# Patient Record
Sex: Male | Born: 2000 | Race: Black or African American | Hispanic: No | Marital: Single | Smoking: Never smoker
Health system: Southern US, Community
[De-identification: ages and names within clinical notes are randomized; demographics above are authoritative.]

---
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
## 2021-09-11 ENCOUNTER — Other Ambulatory Visit: Payer: Self-pay

## 2021-09-11 ENCOUNTER — Ambulatory Visit: Payer: Medicaid Other | Attending: Internal Medicine

## 2021-09-11 ENCOUNTER — Other Ambulatory Visit (HOSPITAL_BASED_OUTPATIENT_CLINIC_OR_DEPARTMENT_OTHER): Payer: Self-pay

## 2021-09-11 DIAGNOSIS — Z23 Encounter for immunization: Secondary | ICD-10-CM

## 2021-09-11 NOTE — Progress Notes (Signed)
   Covid-19 Vaccination Clinic  Name:  Daniel Ellison    MRN: [id number] DOB: 2001-08-07  09/11/2021  [name] was observed post Covid-19 immunization for 15 minutes without incident. He was provided with Vaccine Information Sheet and instruction to access the V-Safe system.   [name] was instructed to call 911 with any severe reactions post vaccine: Difficulty breathing  Swelling of face and throat  A fast heartbeat  A bad rash all over body  Dizziness and weakness   Immunizations Administered     Name Date Dose VIS Date Route   PFIZER Comrnaty(Gray TOP) Covid-19 Vaccine 09/11/2021  3:28 PM 0.3 mL 08/14/2021 Intramuscular   Manufacturer: [employer], [employer]   Lot: [id number]   NDC: [phone]

## 2021-12-04 ENCOUNTER — Emergency Department (HOSPITAL_BASED_OUTPATIENT_CLINIC_OR_DEPARTMENT_OTHER)
Admission: EM | Admit: 2021-12-04 | Discharge: 2021-12-04 | Disposition: A | Discharge status: Home / Self Care | Payer: Medicaid Other | Attending: Emergency Medicine | Admitting: Emergency Medicine

## 2021-12-04 ENCOUNTER — Encounter (HOSPITAL_BASED_OUTPATIENT_CLINIC_OR_DEPARTMENT_OTHER): Payer: Self-pay | Admitting: Emergency Medicine

## 2021-12-04 ENCOUNTER — Other Ambulatory Visit: Payer: Self-pay

## 2021-12-04 ENCOUNTER — Emergency Department (HOSPITAL_BASED_OUTPATIENT_CLINIC_OR_DEPARTMENT_OTHER): Payer: Medicaid Other | Admitting: Radiology

## 2021-12-04 DIAGNOSIS — X501XXA Overexertion from prolonged static or awkward postures, initial encounter: Secondary | ICD-10-CM | POA: Diagnosis not present

## 2021-12-04 DIAGNOSIS — S93402A Sprain of unspecified ligament of left ankle, initial encounter: Secondary | ICD-10-CM | POA: Diagnosis not present

## 2021-12-04 DIAGNOSIS — Y9367 Activity, basketball: Secondary | ICD-10-CM | POA: Diagnosis not present

## 2021-12-04 DIAGNOSIS — S99912A Unspecified injury of left ankle, initial encounter: Secondary | ICD-10-CM | POA: Diagnosis present

## 2021-12-04 NOTE — Discharge Instructions (Addendum)
Your XR was negative for fractures. You likely have an ankle sprain. These can take anywhere from 1 week to 6 weeks for complete return to normal.  Please schedule an appointment with Sports Medicine if symptoms do not start improving. Recommend compression, Ice/Heat, elevation, and rest of ankle for at least one week. Please avoid strenuous physical activity until symptoms resolve.

## 2021-12-04 NOTE — ED Triage Notes (Signed)
Presents for L ankle injury that occurred Saturday while playing basketball, landed on joint while twisted. Able to bear weight, denies numbness/tingling to limb. Has tried ice and advil at home with some relief.

## 2021-12-04 NOTE — ED Provider Notes (Signed)
[facility] EMERGENCY DEPT Provider Note   CSN: [id number] Arrival date & time: 12/04/21  2043     History Chief Complaint  Patient presents with   Ankle Pain    Daniel Ellison is a 20 y.o. male. Patient presents the emergency department with a left ankle injury that occurred last Saturday.  He was playing basketball at the time where he rolled his ankle.  Swelling is present on the lateral ankle.  He has been able to bear weight on it.  He has had no numbness or tingling to the leg.  He does have limited range of motion of his ankle.  Ankle Pain     History reviewed. No pertinent past medical history.  There are no problems to display for this patient.   History reviewed. No pertinent surgical history.     Family History  Problem Relation Age of Onset   Healthy Mother    Healthy Father     Social History   Tobacco Use   Smoking status: Never   Smokeless tobacco: Never  Vaping Use   Vaping Use: Never used  Substance Use Topics   Alcohol use: Never   Drug use: Never    Home Medications Prior to Admission medications   Medication Sig Start Date End Date Taking? Authorizing Provider  cromolyn (NASALCROM) 5.2 MG/ACT nasal spray Place 1 spray into both nostrils 4 (four) times daily. 09/18/18   Georgetta Haber, NP  fluticasone (FLONASE) 50 MCG/ACT nasal spray Place 1 spray into both nostrils daily. 09/18/18   Georgetta Haber, NP    Allergies    Patient has no known allergies.  Review of Systems   Review of Systems  Musculoskeletal:  Positive for arthralgias and joint swelling.  Neurological:  Negative for weakness and numbness.  All other systems reviewed and are negative.  Physical Exam Updated Vital Signs BP (!) 146/95 (BP Location: Right Arm)    Pulse 67    Temp 98.1 F (36.7 C) (Oral)    Resp 18    Wt 66.2 kg    SpO2 100%   Physical Exam Vitals and nursing note reviewed.  Constitutional:      General: He is not in acute  distress.    Appearance: Normal appearance. He is well-developed. He is not ill-appearing, toxic-appearing or diaphoretic.  HENT:     Head: Normocephalic and atraumatic.     Nose: No nasal deformity.     Mouth/Throat:     Lips: Pink. No lesions.  Eyes:     General: Gaze aligned appropriately. No scleral icterus.       Right eye: No discharge.        Left eye: No discharge.     Conjunctiva/sclera: Conjunctivae normal.     Right eye: Right conjunctiva is not injected. No exudate or hemorrhage.    Left eye: Left conjunctiva is not injected. No exudate or hemorrhage. Pulmonary:     Effort: Pulmonary effort is normal. No respiratory distress.  Musculoskeletal:     Right ankle: Normal.     Left ankle: Swelling present. No deformity, ecchymosis or lacerations. Tenderness present over the lateral malleolus. Decreased range of motion. Normal pulse.     Comments: Patient with swelling to the lateral side of the ankle on the left foot.  He does have decreased range of motion here.  And tenderness to palpation.  Pedal pulses 2+ bilaterally.  Sensation is grossly intact.  Able to wiggle toes easily.  Skin:  General: Skin is warm and dry.  Neurological:     Mental Status: He is alert and oriented to person, place, and time.  Psychiatric:        Mood and Affect: Mood normal.        Speech: Speech normal.        Behavior: Behavior normal. Behavior is cooperative.    ED Results / Procedures / Treatments   Labs (all labs ordered are listed, but only abnormal results are displayed) Labs Reviewed - No data to display  EKG None  Radiology DG Ankle Complete Left  Result Date: 12/04/2021 CLINICAL DATA:  injury EXAM: LEFT ANKLE COMPLETE - 3+ VIEW COMPARISON:  None. FINDINGS: There is no evidence of fracture, dislocation, or joint effusion. There is no evidence of arthropathy or other focal bone abnormality. Soft tissues are unremarkable. IMPRESSION: Negative. Electronically Signed   By: Tish Frederickson M.D.   On: 12/04/2021 21:29    Procedures Procedures   Medications Ordered in ED Medications - No data to display  ED Course  I have reviewed the triage vital signs and the nursing notes.  Pertinent labs & imaging results that were available during my care of the patient were reviewed by me and considered in my medical decision making (see chart for details).    MDM Rules/Calculators/A&P                          This is a well-appearing 20 year old male who presents to the emergency department with left ankle swelling and pain after basketball injury that occurred 3 nights ago.  He has been weightbearing at home. Vital stable. X-ray obtained.  It is negative for any acute fracture.  This is likely a ankle sprain.  Supportive treatment at home recommended.  Follow-up with sports medicine doctor if symptoms do not start improving.     Final Clinical Impression(s) / ED Diagnoses Final diagnoses:  Sprain of left ankle, unspecified ligament, initial encounter    Rx / DC Orders ED Discharge Orders     None        Claudie Leach, PA-C 12/04/21 2136    Vanetta Mulders, MD 12/05/21 1725

## 2022-10-27 IMAGING — DX DG ANKLE COMPLETE 3+V*L*
1 series · 3 of 3 positions shown · non-contrast
Comparison: None.

CLINICAL DATA: injury

EXAM:
LEFT ANKLE COMPLETE - 3+ VIEW

[Series 1: ankle · 0.14mm/px · 3 of 3 slices shown]
[im 1/3]
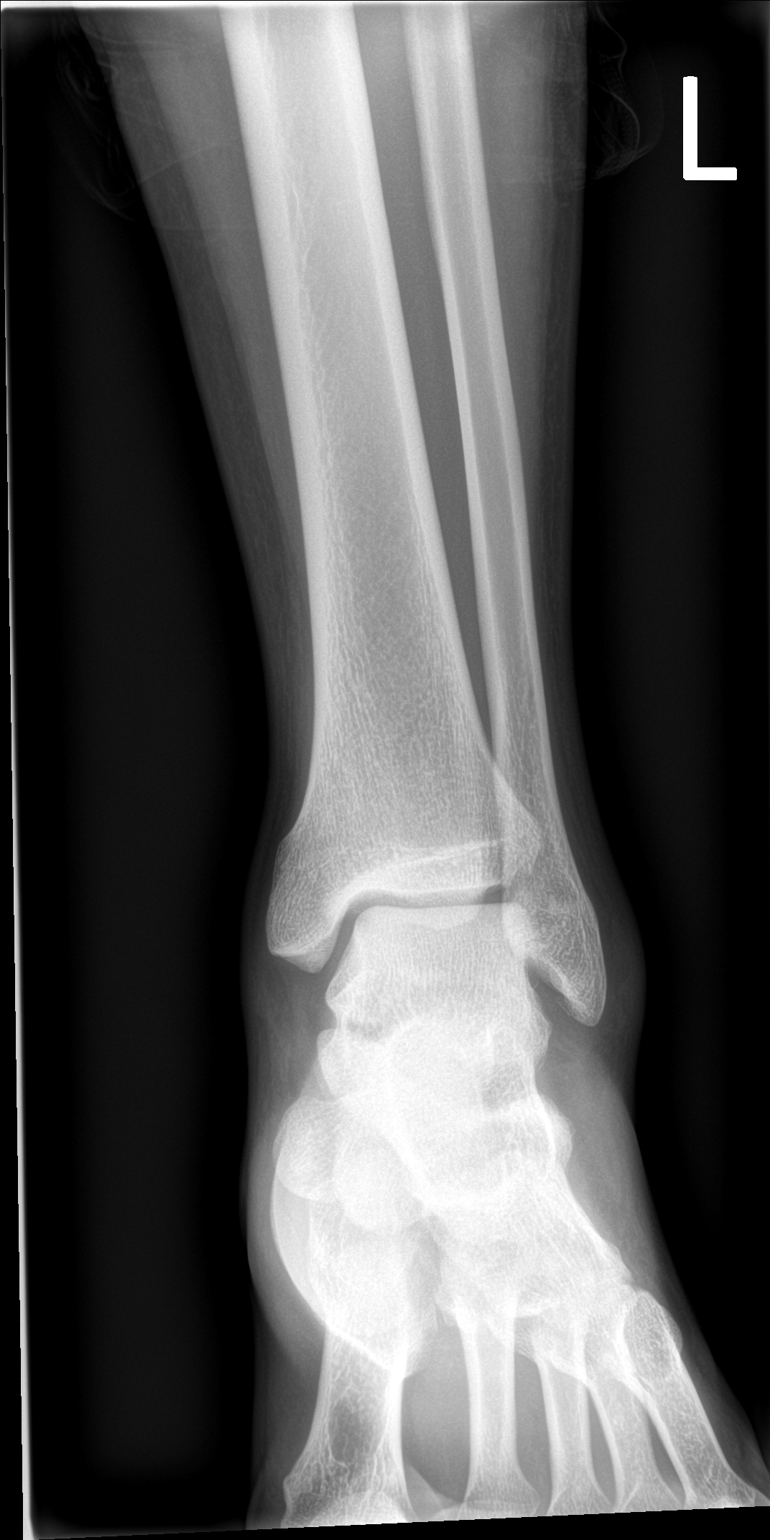
[im 2/3]
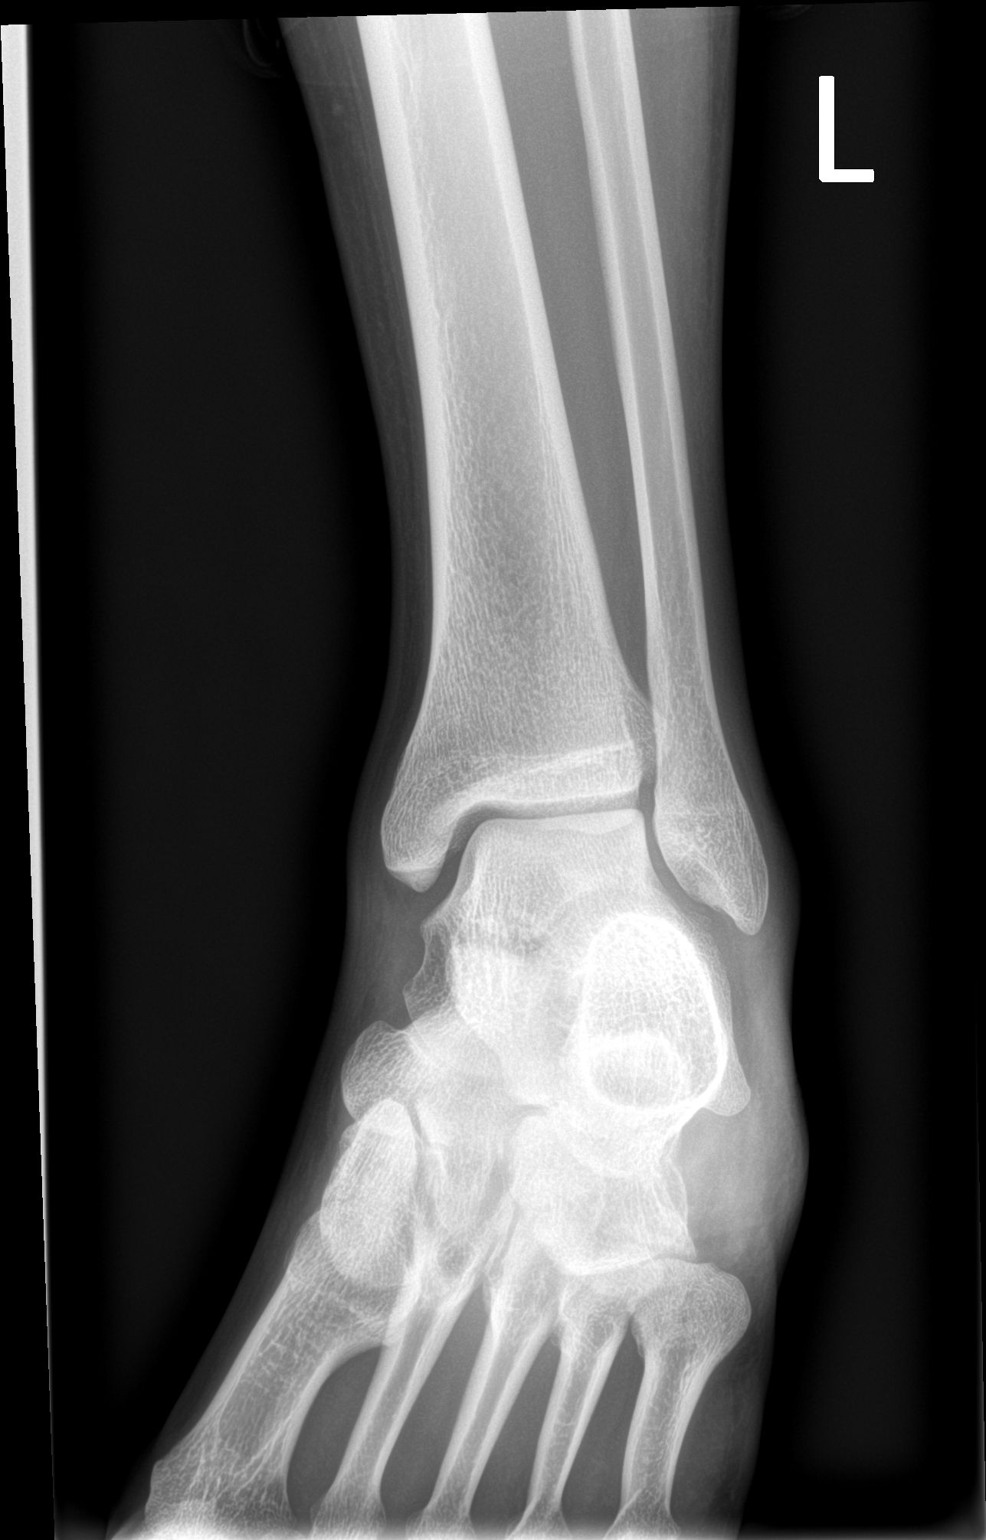
[im 3/3]
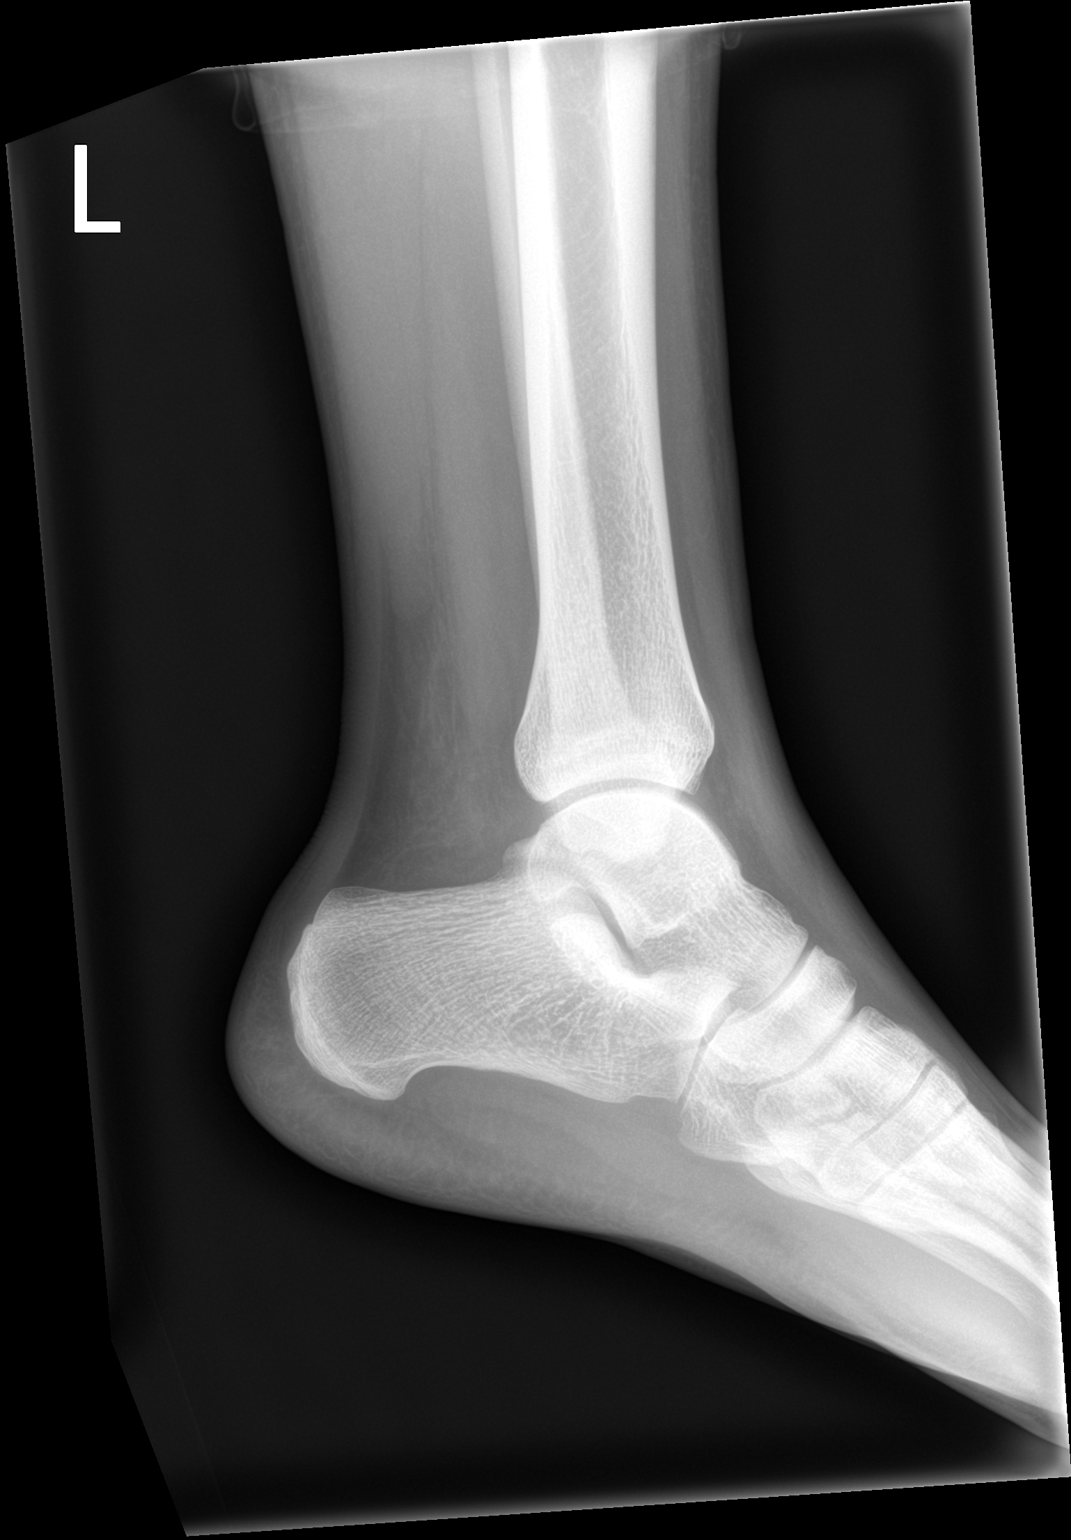

[3 of 3 positions shown; findings below may reference images not displayed]

FINDINGS: There is no evidence of fracture, dislocation, or joint effusion.
There is no evidence of arthropathy or other focal bone abnormality.
Soft tissues are unremarkable.
IMPRESSION: Negative.

## 2023-02-13 ENCOUNTER — Encounter (HOSPITAL_BASED_OUTPATIENT_CLINIC_OR_DEPARTMENT_OTHER): Payer: Self-pay | Admitting: Emergency Medicine

## 2023-02-13 ENCOUNTER — Emergency Department (HOSPITAL_BASED_OUTPATIENT_CLINIC_OR_DEPARTMENT_OTHER)
Admission: EM | Admit: 2023-02-13 | Discharge: 2023-02-14 | Disposition: A | Discharge status: Home / Self Care | Payer: BC Managed Care – PPO | Attending: Emergency Medicine | Admitting: Emergency Medicine

## 2023-02-13 ENCOUNTER — Other Ambulatory Visit: Payer: Self-pay

## 2023-02-13 DIAGNOSIS — Z1152 Encounter for screening for COVID-19: Secondary | ICD-10-CM | POA: Diagnosis not present

## 2023-02-13 DIAGNOSIS — L0291 Cutaneous abscess, unspecified: Secondary | ICD-10-CM

## 2023-02-13 DIAGNOSIS — L0211 Cutaneous abscess of neck: Secondary | ICD-10-CM | POA: Insufficient documentation

## 2023-02-13 DIAGNOSIS — R509 Fever, unspecified: Secondary | ICD-10-CM | POA: Diagnosis present

## 2023-02-13 NOTE — ED Triage Notes (Signed)
Fever highest 101 tonight. Denies cough/cold symptoms  Has "hair bump" on neck  pt concerned for infection. Swelling on left side of neck Took advil around 8pm

## 2023-02-14 LAB — RESP PANEL BY RT-PCR (RSV, FLU A&B, COVID)  RVPGX2
Influenza A by PCR: NEGATIVE
Influenza B by PCR: NEGATIVE
Resp Syncytial Virus by PCR: NEGATIVE
SARS Coronavirus 2 by RT PCR: NEGATIVE

## 2023-02-14 NOTE — Discharge Instructions (Signed)
You were seen today for fever and an abscess on the face.  The abscess is draining.  Continue to apply warm compresses.  You do not have any evidence of cellulitis at this time.  Regarding her fever, COVID and influenza testing are negative.  Monitor symptoms closely.

## 2023-02-14 NOTE — ED Provider Notes (Signed)
[facility] Provider Note   CSN: [id number] Arrival date & time: 02/13/23  2322     History  Chief Complaint  Patient presents with   Fever    Daniel Ellison is a 22 y.o. male.  HPI     This is a 22 year old male who presents with concerns for fever and a bump on the neck.  Mother reports that several days ago she noted a bump in his beard.  She has been squeezing it and it has been draining.  She not noted any redness.  Tonight he began to feel chilled and she took his temperature temp orally and it was 101.  He has not had any other infectious symptoms such as cough, congestion, sore throat.  Denies difficulty swallowing.  She did give him an Advil around 8 PM.  Home Medications Prior to Admission medications   Medication Sig Start Date End Date Taking? Authorizing Provider  cromolyn (NASALCROM) 5.2 MG/ACT nasal spray Place 1 spray into both nostrils 4 (four) times daily. 09/18/18   Zigmund Gottron, NP  fluticasone (FLONASE) 50 MCG/ACT nasal spray Place 1 spray into both nostrils daily. 09/18/18   Zigmund Gottron, NP      Allergies    Patient has no known allergies.    Review of Systems   Review of Systems  Constitutional:  Positive for fever.  HENT:  Negative for congestion.   Respiratory:  Negative for cough.   Skin:        Abscess  All other systems reviewed and are negative.   Physical Exam Updated Vital Signs BP 135/83   Pulse 92   Temp 98 F (36.7 C)   Resp 18   SpO2 99%  Physical Exam Vitals and nursing note reviewed.  Constitutional:      Appearance: Normal appearance. He is well-developed. He is not ill-appearing or toxic-appearing.  HENT:     Head: Normocephalic and atraumatic.     Nose: Nose normal.     Mouth/Throat:     Mouth: Mucous membranes are moist.  Eyes:     Pupils: Pupils are equal, round, and reactive to light.  Neck:     Comments: Indurated area left neck with open pustule, no  active drainage, no adjacent erythema, no current fluctuance Cardiovascular:     Rate and Rhythm: Normal rate and regular rhythm.     Heart sounds: Normal heart sounds. No murmur heard. Pulmonary:     Effort: Pulmonary effort is normal. No respiratory distress.     Breath sounds: Normal breath sounds. No wheezing.  Abdominal:     Palpations: Abdomen is soft.     Tenderness: There is no abdominal tenderness. There is no rebound.  Musculoskeletal:     Cervical back: Neck supple.  Lymphadenopathy:     Cervical: No cervical adenopathy.  Skin:    General: Skin is warm and dry.  Neurological:     Mental Status: He is alert and oriented to person, place, and time.  Psychiatric:        Mood and Affect: Mood normal.     ED Results / Procedures / Treatments   Labs (all labs ordered are listed, but only abnormal results are displayed) Labs Reviewed  RESP PANEL BY RT-PCR (RSV, FLU A&B, COVID)  [id number]    EKG None  Radiology No results found.  Procedures Procedures    Medications Ordered in ED Medications - No data to display  ED Course/  Medical Decision Making/ A&P                             Medical Decision Making  This patient presents to the ED for concern of fever, abscess, this involves an extensive number of treatment options, and is a complaint that carries with it a high risk of complications and morbidity.  I considered the following differential and admission for this acute, potentially life threatening condition.  The differential diagnosis includes viral illness, bacterial infection such as cellulitis  MDM:    This is a 22 year old male who presents with concern for fever.  He is nontoxic and vital signs are reassuring.  He is afebrile here.  He is quite well-appearing.  He has no infectious symptoms.  He does have what appears to be a follicular abscess in the left neck.  No current drainage, there is some induration but no fluctuance.  There is no adjacent  erythema to suggest cellulitis.  It is self-limited and is not causing swelling of the face or the neck.  Do not feel that this is the source of any fever.  Question whether the temperature at home may have been inaccurate given it was taken temp orally.  Regardless, he is well-appearing.  COVID and influenza are negative.  He has no other infectious symptoms.  At this time, given draining of the abscess, would not further incise.  Recommend warm compresses.  Mother and patient were given return precautions.  (Labs, imaging, consults)  Labs: I Ordered, and personally interpreted labs.  The pertinent results include: COVID and influenza testing  Imaging Studies ordered: I ordered imaging studies including a I independently visualized and interpreted imaging. I agree with the radiologist interpretation  Additional history obtained from mother.  External records from outside source obtained and reviewed including prior evaluations  Cardiac Monitoring: The patient was not maintained on a cardiac monitor.  If on the cardiac monitor, I personally viewed and interpreted the cardiac monitored which showed an underlying rhythm of: N/A  Reevaluation: After the interventions noted above, I reevaluated the patient and found that they have :stayed the same  Social Determinants of Health:  lives independently  Disposition: Discharge  Co morbidities that complicate the patient evaluation History reviewed. No pertinent past medical history.   Medicines No orders of the defined types were placed in this encounter.   I have reviewed the patients home medicines and have made adjustments as needed  Problem List / ED Course: Problem List Items Addressed This Visit   None Visit Diagnoses     Fever, unspecified fever cause    -  Primary   Abscess                       Final Clinical Impression(s) / ED Diagnoses Final diagnoses:  Fever, unspecified fever cause  Abscess    Rx / DC  Orders ED Discharge Orders     None         Merryl Hacker, MD 02/14/23 [id number]
